# Patient Record
Sex: Male | Born: 1960 | Race: Black or African American | Hispanic: No | Marital: Single | Smoking: Never smoker
Health system: Southern US, Community
[De-identification: ages and names within clinical notes are randomized; demographics above are authoritative.]

## PROBLEM LIST (undated history)

## (undated) DIAGNOSIS — I10 Essential (primary) hypertension: Secondary | ICD-10-CM

---

## 1998-03-24 ENCOUNTER — Encounter: Admission: RE | Admit: 1998-03-24 | Discharge: 1998-03-24 | Payer: Self-pay | Admitting: *Deleted

## 2018-10-10 ENCOUNTER — Encounter (HOSPITAL_COMMUNITY): Payer: Self-pay

## 2018-10-10 ENCOUNTER — Other Ambulatory Visit: Payer: Self-pay

## 2018-10-10 ENCOUNTER — Emergency Department (HOSPITAL_COMMUNITY): Payer: BLUE CROSS/BLUE SHIELD

## 2018-10-10 ENCOUNTER — Emergency Department (HOSPITAL_COMMUNITY)
Admission: EM | Admit: 2018-10-10 | Discharge: 2018-10-10 | Disposition: A | Discharge status: Home / Self Care | Payer: BLUE CROSS/BLUE SHIELD | Attending: Emergency Medicine | Admitting: Emergency Medicine

## 2018-10-10 DIAGNOSIS — N451 Epididymitis: Secondary | ICD-10-CM

## 2018-10-10 DIAGNOSIS — N50811 Right testicular pain: Secondary | ICD-10-CM | POA: Diagnosis present

## 2018-10-10 MED ORDER — LEVOFLOXACIN 500 MG PO TABS: 500.0000 mg | ORAL_TABLET | Freq: Every day | ORAL | 0 refills | Status: DC

## 2018-10-10 MED ORDER — CEFTRIAXONE SODIUM 250 MG IJ SOLR
250.0000 mg | Freq: Once | INTRAMUSCULAR | Status: AC
  Administered 2018-10-10: 250 mg via INTRAMUSCULAR
  Filled 2018-10-10: qty 250

## 2018-10-10 MED ORDER — STERILE WATER FOR INJECTION IJ SOLN
INTRAMUSCULAR | Status: AC
  Administered 2018-10-10: 1 mL
  Filled 2018-10-10: qty 10

## 2018-10-10 NOTE — ED Triage Notes (Signed)
Patient c/o right testicular swelling and pain since yesterday.

## 2018-10-10 NOTE — ED Provider Notes (Signed)
[facility]-EMERGENCY DEPT Provider Note   CSN: [id number] Arrival date & time: 10/10/18  1607    History   Chief Complaint Chief Complaint  Patient presents with  . Groin Swelling    HPI Connor Matthews is a 58 y.o. male who presents with testicular pain and swelling. No significant PMH. He states that for the past 2 days he has had swelling and pain of his right testicle. Nothing makes it better or worse. He has not had any difficult urinating. He has never had this before. He is sexually active with a new partner and does not use condoms. No fever, vomiting, abdominal pain, penile discharge.   HPI  History reviewed. No pertinent past medical history.  There are no active problems to display for this patient.   History reviewed. No pertinent surgical history.      Home Medications    Prior to Admission medications   Not on File    Family History Family History  Problem Relation Age of Onset  . Hypertension Mother     Social History Social History   Tobacco Use  . Smoking status: Never Smoker  . Smokeless tobacco: Never Used  Substance Use Topics  . Alcohol use: Yes    Comment: occasionally  . Drug use: Never     Allergies   Patient has no known allergies.   Review of Systems Review of Systems  Constitutional: Negative for fever.  Gastrointestinal: Negative for abdominal pain and vomiting.  Genitourinary: Positive for scrotal swelling and testicular pain. Negative for difficulty urinating, discharge, dysuria, flank pain and penile pain.  All other systems reviewed and are negative.    Physical Exam Updated Vital Signs BP (!) 177/89 (BP Location: Right Arm)   Pulse 86   Temp 98.6 F (37 C) (Oral)   Resp 18   Ht 5\' 11"  (1.803 m)   Wt (!) 138.3 kg   SpO2 100%   BMI 42.54 kg/m   Physical Exam Vitals signs and nursing note reviewed.  Constitutional:      General: He is not in acute distress.    Appearance: He is  well-developed. He is obese. He is not ill-appearing.     Comments: Calm and cooperative  HENT:     Head: Normocephalic and atraumatic.  Eyes:     General: No scleral icterus.       Right eye: No discharge.        Left eye: No discharge.     Conjunctiva/sclera: Conjunctivae normal.     Pupils: Pupils are equal, round, and reactive to light.  Neck:     Musculoskeletal: Normal range of motion.  Cardiovascular:     Rate and Rhythm: Normal rate and regular rhythm.  Pulmonary:     Effort: Pulmonary effort is normal. No respiratory distress.     Breath sounds: Normal breath sounds.  Abdominal:     General: There is no distension.     Palpations: Abdomen is soft.     Tenderness: There is no abdominal tenderness.  Genitourinary:    Comments: No inguinal lymphadenopathy or inguinal hernia noted. Normal uncircumcised penis free of lesions or rash. Right testicle is red and swollen. There is whitish-yellow discharge from the penis. Chaperone present during exam.   Skin:    General: Skin is warm and dry.  Neurological:     Mental Status: He is alert and oriented to person, place, and time.  Psychiatric:  Behavior: Behavior normal.      ED Treatments / Results  Labs (all labs ordered are listed, but only abnormal results are displayed) Labs Reviewed  RPR  HIV ANTIBODY (ROUTINE TESTING W REFLEX)  URINALYSIS, ROUTINE W REFLEX MICROSCOPIC  GC/CHLAMYDIA PROBE AMP () NOT AT [facility]    EKG None  Radiology US Scrotum W/doppler  Result Date: 10/10/2018 CLINICAL DATA:  Right scrotal pain and swelling for 1 day EXAM: SCROTAL ULTRASOUND DOPPLER ULTRASOUND OF THE TESTICLES TECHNIQUE: Complete ultrasound examination of the testicles, epididymis, and other scrotal structures was performed. Color and spectral Doppler ultrasound were also utilized to evaluate blood flow to the testicles. COMPARISON:  None. FINDINGS: Right testicle Measurements: 3.2 x 2.6 x 2.1 cm. No mass or  microlithiasis visualized. Left testicle Measurements: 3.3 x 1.9 x 2.2 cm. No mass or microlithiasis visualized. Right epididymis: Right epididymis appears diffusely thickened and hypervascular, suggesting acute epididymitis. There is prominent thickening of the right superior scrotal soft tissues with associated mixed echogenicity. Left epididymis:  Within normal limits. Hydrocele:  Small right hydrocele.  No left hydrocele. Varicocele: No right varicocele. Mild left varicocele with echogenic lumen probably due to slow flow. Pulsed Doppler interrogation of both testes demonstrates normal low resistance arterial and venous waveforms bilaterally. IMPRESSION: 1. Normal testes.  No evidence of testicular torsion. 2. Diffusely thickened and hypervascular right epididymis, suggesting acute epididymitis. 3. Prominent thickening of the right superior scrotal soft tissues with associated mixed echogenicity, of uncertain etiology. While potentially representing reactive edema, a right inguinal hernia can not be excluded based on these images. Suggest CT abdomen/pelvis with oral and IV contrast for further evaluation. 4. Small right hydrocele. 5. Mild left varicocele. Electronically Signed   By: Delbert Phenix M.D.   On: 10/10/2018 19:00    Procedures Procedures (including critical care time)  Medications Ordered in ED Medications  cefTRIAXone (ROCEPHIN) injection 250 mg (has no administration in time range)     Initial Impression / Assessment and Plan / ED Course  I have reviewed the triage vital signs and the nursing notes.  Pertinent labs & imaging results that were available during my care of the patient were reviewed by me and considered in my medical decision making (see chart for details).  58 year old male presents with R testicular pain and swelling. He is hypertensive but otherwise vitals are normal. On exam his right testicle is red and swollen and has discharge from the penis. Although he is older,  suspect he may have an STD. [place] confirms epididymitis. He was given IM Rocephin here and rx for Levaquin. Advised return if worsening.  Final Clinical Impressions(s) / ED Diagnoses   Final diagnoses:  Epididymitis    ED Discharge Orders    None       Bethel Born, PA-C 10/10/18 1946    Benjiman Core, MD 10/10/18 2355

## 2018-10-10 NOTE — Discharge Instructions (Signed)
Take Levaquin (antibiotic) for the next 10 days You can also ice the swollen area and take Ibuprofen for pain Return if worsening

## 2018-10-11 LAB — HIV ANTIBODY (ROUTINE TESTING W REFLEX): HIV Screen 4th Generation wRfx: NONREACTIVE

## 2018-10-11 LAB — RPR: RPR Ser Ql: NONREACTIVE

## 2018-10-13 LAB — GC/CHLAMYDIA PROBE AMP (~~LOC~~) NOT AT ARMC
Chlamydia: NEGATIVE
Neisseria Gonorrhea: NEGATIVE

## 2019-06-18 IMAGING — US ULTRASOUND SCROTUM DOPPLER COMPLETE
1 series · 13 of 25 positions shown · non-contrast
Comparison: None.

CLINICAL DATA: Right scrotal pain and swelling for 1 day

EXAM:
SCROTAL ULTRASOUND
DOPPLER ULTRASOUND OF THE TESTICLES
TECHNIQUE: Complete ultrasound examination of the testicles, epididymis, and
other scrotal structures was performed. Color and spectral Doppler
ultrasound were also utilized to evaluate blood flow to the
testicles.

[Series 1: ultrasound scrotum doppler complete · 73 acquisitions, 13 frames shown]
[im 1/73]
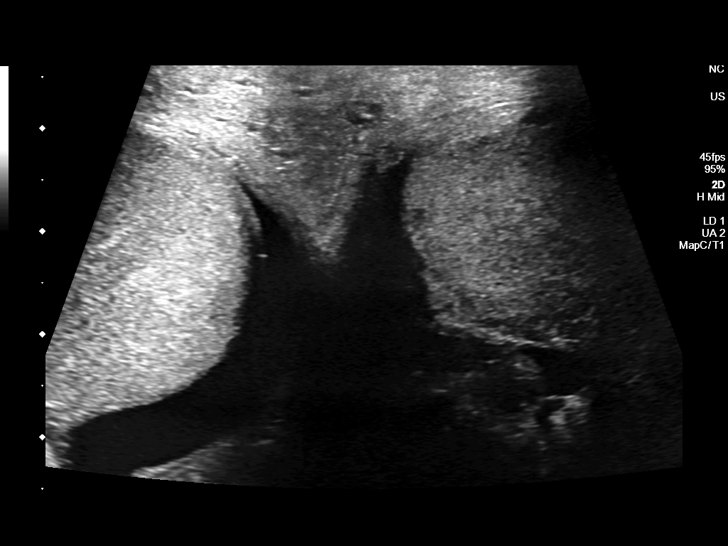
[im 7/73]
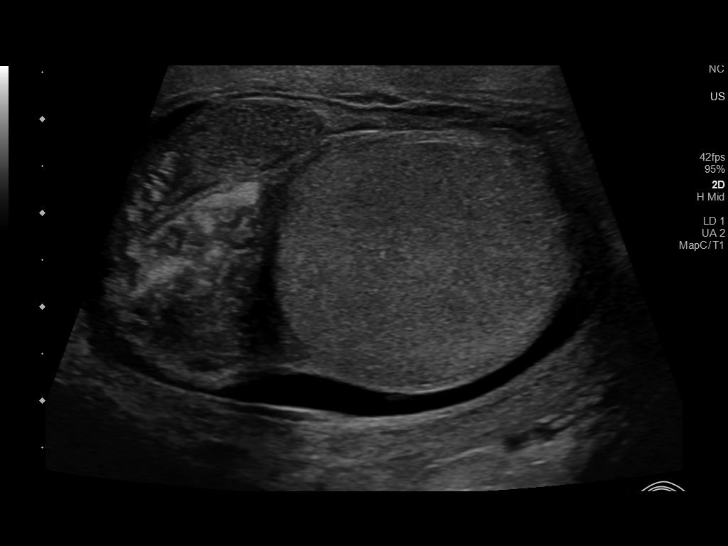
[im 13/73]
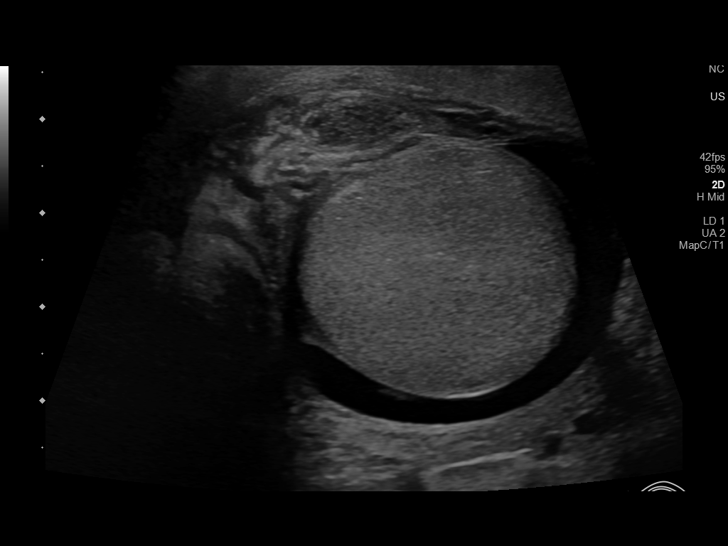
[im 19/73]
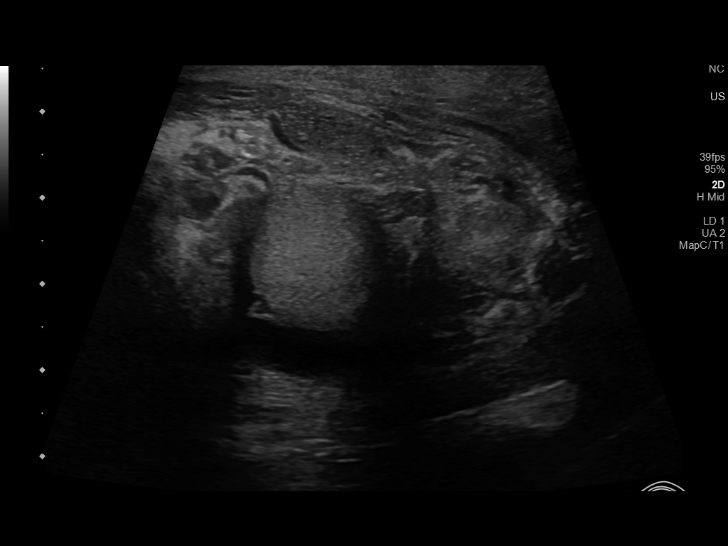
[im 25/73]
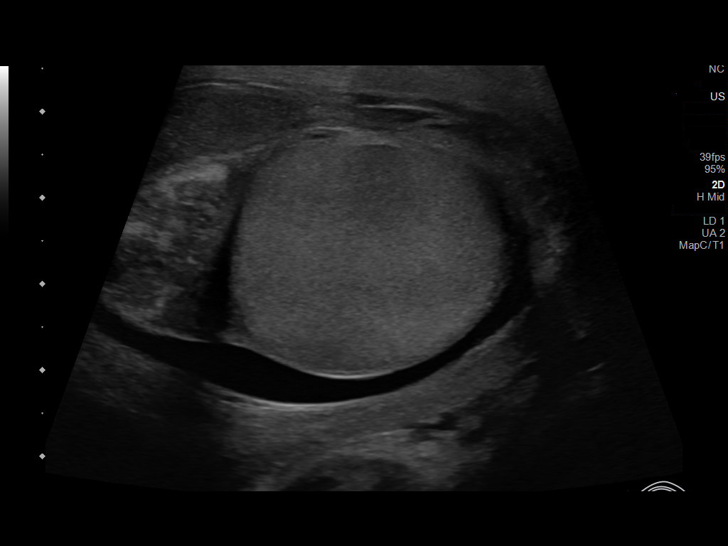
[im 31/73]
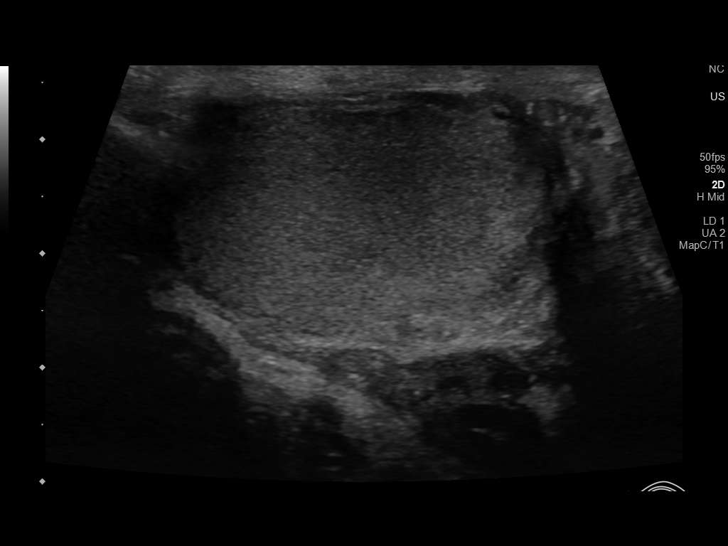
[im 37/73]
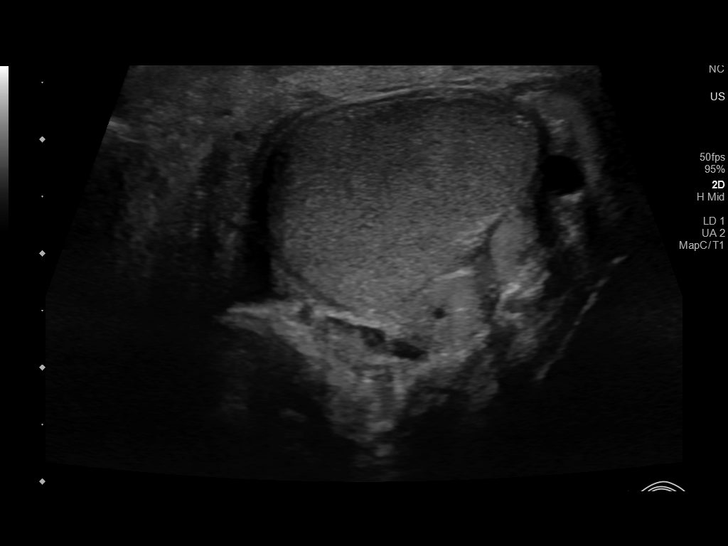
[im 43/73]
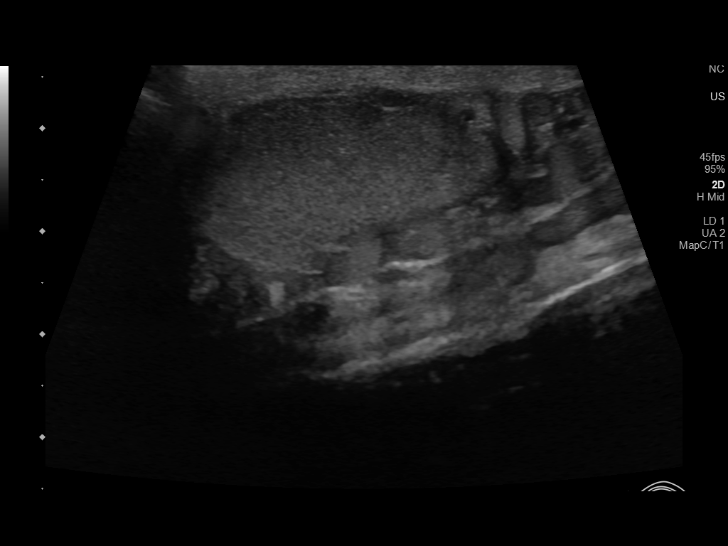
[im 49/73]
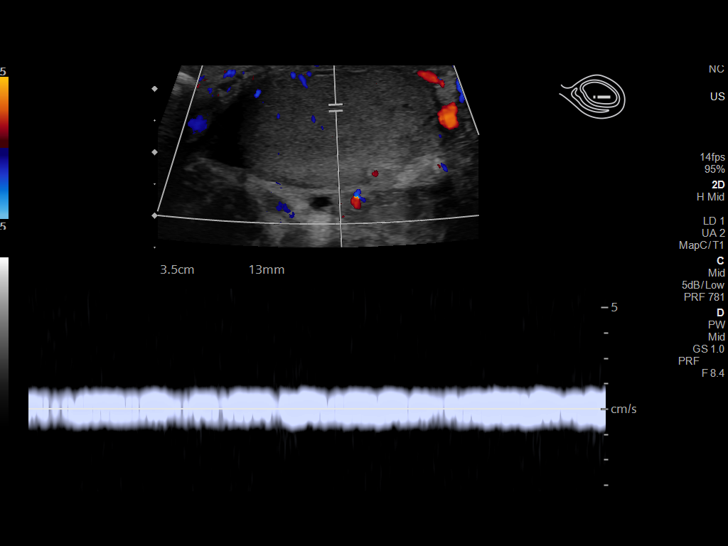
[im 55/73]
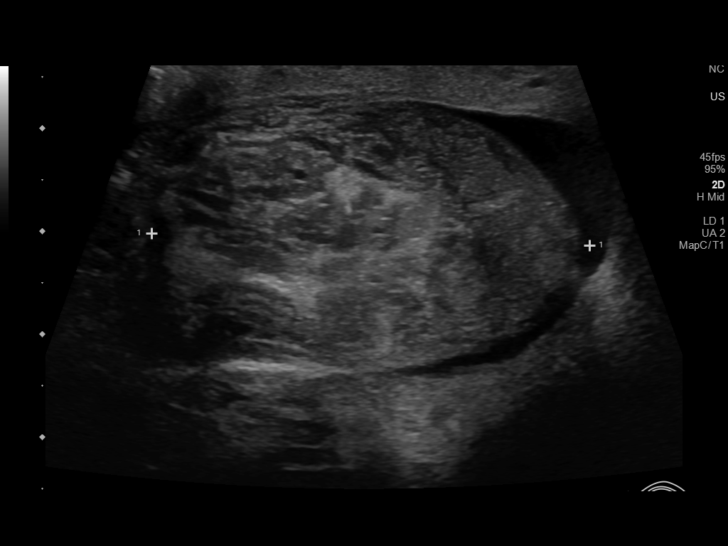
[im 61/73]
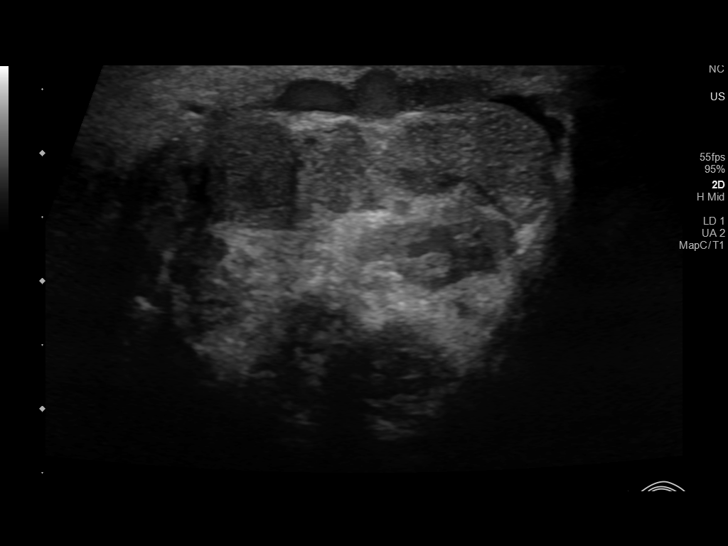
[im 67/73]
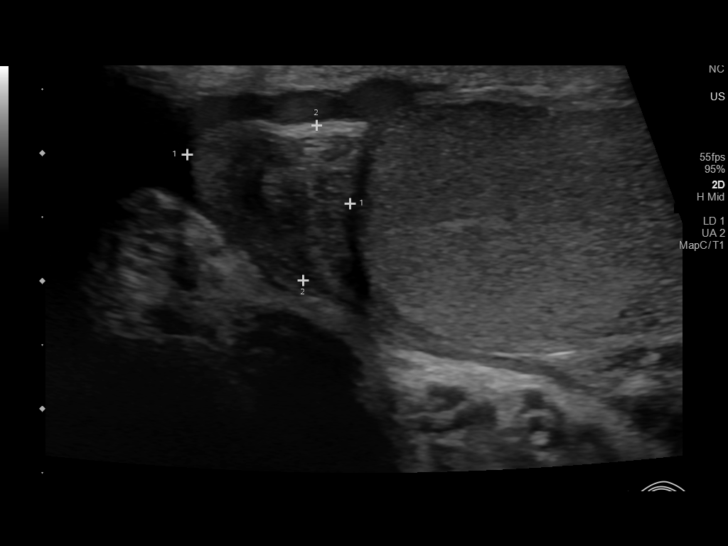
[im 73/73]
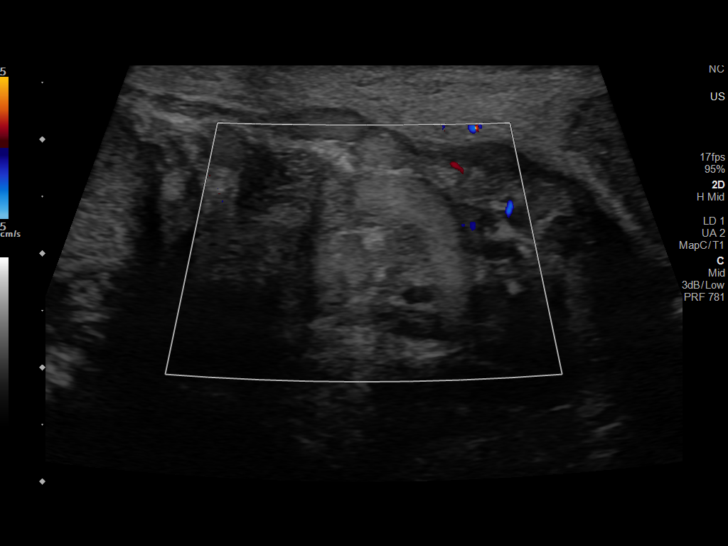

[13 of 25 positions shown; findings below may reference images not displayed]

FINDINGS: Right testicle

Measurements: 3.2 x 2.6 x 2.1 cm. No mass or microlithiasis
visualized.

Left testicle

Measurements: 3.3 x 1.9 x 2.2 cm. No mass or microlithiasis
visualized.

Right epididymis: Right epididymis appears diffusely thickened and
hypervascular, suggesting acute epididymitis. There is prominent
thickening of the right superior scrotal soft tissues with
associated mixed echogenicity.

Left epididymis:  Within normal limits.

Hydrocele:  Small right hydrocele.  No left hydrocele.

Varicocele: No right varicocele. Mild left varicocele with echogenic
lumen probably due to slow flow.

Pulsed Doppler interrogation of both testes demonstrates normal low
resistance arterial and venous waveforms bilaterally.
IMPRESSION: 1. Normal testes.  No evidence of testicular torsion.
2. Diffusely thickened and hypervascular right epididymis,
suggesting acute epididymitis.
3. Prominent thickening of the right superior scrotal soft tissues
with associated mixed echogenicity, of uncertain etiology. While
potentially representing reactive edema, a right inguinal hernia can
not be excluded based on these images. Suggest CT abdomen/pelvis
with oral and IV contrast for further evaluation.
4. Small right hydrocele.
5. Mild left varicocele.

## 2021-05-22 ENCOUNTER — Emergency Department (HOSPITAL_BASED_OUTPATIENT_CLINIC_OR_DEPARTMENT_OTHER)
Admission: EM | Admit: 2021-05-22 | Discharge: 2021-05-22 | Disposition: A | Discharge status: Home / Self Care | Payer: BLUE CROSS/BLUE SHIELD | Attending: Student | Admitting: Student

## 2021-05-22 ENCOUNTER — Other Ambulatory Visit: Payer: Self-pay

## 2021-05-22 ENCOUNTER — Emergency Department (HOSPITAL_BASED_OUTPATIENT_CLINIC_OR_DEPARTMENT_OTHER): Payer: BLUE CROSS/BLUE SHIELD | Admitting: Radiology

## 2021-05-22 ENCOUNTER — Encounter (HOSPITAL_BASED_OUTPATIENT_CLINIC_OR_DEPARTMENT_OTHER): Payer: Self-pay | Admitting: *Deleted

## 2021-05-22 DIAGNOSIS — I10 Essential (primary) hypertension: Secondary | ICD-10-CM | POA: Insufficient documentation

## 2021-05-22 DIAGNOSIS — M545 Low back pain, unspecified: Secondary | ICD-10-CM | POA: Insufficient documentation

## 2021-05-22 DIAGNOSIS — Y92410 Unspecified street and highway as the place of occurrence of the external cause: Secondary | ICD-10-CM | POA: Diagnosis not present

## 2021-05-22 HISTORY — DX: Essential (primary) hypertension: I10

## 2021-05-22 MED ORDER — METHOCARBAMOL 500 MG PO TABS: 500.0000 mg | ORAL_TABLET | Freq: Two times a day (BID) | ORAL | 0 refills | Status: DC
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
## 2022-01-17 ENCOUNTER — Encounter (HOSPITAL_COMMUNITY): Payer: Self-pay

## 2022-01-17 ENCOUNTER — Observation Stay (HOSPITAL_COMMUNITY)
Admission: EM | Admit: 2022-01-17 | Discharge: 2022-01-18 | Disposition: A | Discharge status: Home / Self Care | Payer: BC Managed Care – PPO | Attending: Internal Medicine | Admitting: Internal Medicine

## 2022-01-17 ENCOUNTER — Other Ambulatory Visit: Payer: Self-pay

## 2022-01-17 ENCOUNTER — Emergency Department (HOSPITAL_COMMUNITY): Payer: BC Managed Care – PPO

## 2022-01-17 DIAGNOSIS — M609 Myositis, unspecified: Secondary | ICD-10-CM

## 2022-01-17 DIAGNOSIS — I10 Essential (primary) hypertension: Secondary | ICD-10-CM

## 2022-01-17 DIAGNOSIS — M7989 Other specified soft tissue disorders: Secondary | ICD-10-CM | POA: Diagnosis present

## 2022-01-17 DIAGNOSIS — L03116 Cellulitis of left lower limb: Secondary | ICD-10-CM | POA: Diagnosis not present

## 2022-01-17 DIAGNOSIS — M60862 Other myositis, left lower leg: Secondary | ICD-10-CM | POA: Diagnosis not present

## 2022-01-17 DIAGNOSIS — L0390 Cellulitis, unspecified: Secondary | ICD-10-CM | POA: Diagnosis present

## 2022-01-17 LAB — CBC WITH DIFFERENTIAL/PLATELET
Abs Immature Granulocytes: 0.09 10*3/uL — ABNORMAL HIGH (ref 0.00–0.07)
Basophils Absolute: 0 10*3/uL (ref 0.0–0.1)
Basophils Relative: 0 %
Eosinophils Absolute: 0.2 10*3/uL (ref 0.0–0.5)
Eosinophils Relative: 2 %
HCT: 36.8 % — ABNORMAL LOW (ref 39.0–52.0)
Hemoglobin: 12 g/dL — ABNORMAL LOW (ref 13.0–17.0)
Immature Granulocytes: 1 %
Lymphocytes Relative: 23 %
Lymphs Abs: 2.7 10*3/uL (ref 0.7–4.0)
MCH: 31.8 pg (ref 26.0–34.0)
MCHC: 32.6 g/dL (ref 30.0–36.0)
MCV: 97.6 fL (ref 80.0–100.0)
Monocytes Absolute: 0.7 10*3/uL (ref 0.1–1.0)
Monocytes Relative: 6 %
Neutro Abs: 7.8 10*3/uL — ABNORMAL HIGH (ref 1.7–7.7)
Neutrophils Relative %: 68 %
Platelets: 372 10*3/uL (ref 150–400)
RBC: 3.77 MIL/uL — ABNORMAL LOW (ref 4.22–5.81)
RDW: 11.9 % (ref 11.5–15.5)
WBC: 11.5 10*3/uL — ABNORMAL HIGH (ref 4.0–10.5)
nRBC: 0 % (ref 0.0–0.2)

## 2022-01-17 LAB — BASIC METABOLIC PANEL
Anion gap: 7 (ref 5–15)
BUN: 13 mg/dL (ref 6–20)
CO2: 24 mmol/L (ref 22–32)
Calcium: 8.5 mg/dL — ABNORMAL LOW (ref 8.9–10.3)
Chloride: 110 mmol/L (ref 98–111)
Creatinine, Ser: 0.87 mg/dL (ref 0.61–1.24)
GFR, Estimated: >60 mL/min (ref >60)
Glucose, Bld: 94 mg/dL (ref 70–99)
Potassium: 4 mmol/L (ref 3.5–5.1)
Sodium: 141 mmol/L (ref 135–145)

## 2022-01-17 LAB — CK: Total CK: 532 U/L — ABNORMAL HIGH (ref 49–397)

## 2022-01-17 MED ORDER — HYDRALAZINE HCL 20 MG/ML IJ SOLN: 10.0000 mg | Freq: Four times a day (QID) | INTRAMUSCULAR | Status: DC | PRN

## 2022-01-17 MED ORDER — CEFAZOLIN SODIUM-DEXTROSE 2-4 GM/100ML-% IV SOLN
2.0000 g | Freq: Three times a day (TID) | INTRAVENOUS | Status: DC
  Administered 2022-01-17 – 2022-01-18 (×2): 2 g via INTRAVENOUS
  Filled 2022-01-17 (×3): qty 100

## 2022-01-17 MED ORDER — ACETAMINOPHEN 325 MG PO TABS: 650.0000 mg | ORAL_TABLET | Freq: Four times a day (QID) | ORAL | Status: DC | PRN

## 2022-01-17 MED ORDER — ACETAMINOPHEN 650 MG RE SUPP: 650.0000 mg | Freq: Four times a day (QID) | RECTAL | Status: DC | PRN

## 2022-01-17 MED ORDER — LOSARTAN POTASSIUM 25 MG PO TABS
25.0000 mg | ORAL_TABLET | Freq: Every day | ORAL | Status: DC
  Administered 2022-01-17 – 2022-01-18 (×2): 25 mg via ORAL
  Filled 2022-01-17 (×2): qty 1

## 2022-01-17 MED ORDER — KETOROLAC TROMETHAMINE 30 MG/ML IJ SOLN: 30.0000 mg | Freq: Four times a day (QID) | INTRAMUSCULAR | Status: DC | PRN

## 2022-01-17 MED ORDER — CEFAZOLIN SODIUM-DEXTROSE 2-4 GM/100ML-% IV SOLN
2.0000 g | Freq: Once | INTRAVENOUS | Status: AC
  Administered 2022-01-17: 2 g via INTRAVENOUS
  Filled 2022-01-17: qty 100

## 2022-01-17 MED ORDER — LACTATED RINGERS IV SOLN: INTRAVENOUS | Status: DC

## 2022-01-17 MED ORDER — ONDANSETRON HCL 4 MG PO TABS: 4.0000 mg | ORAL_TABLET | Freq: Four times a day (QID) | ORAL | Status: DC | PRN

## 2022-01-17 MED ORDER — ENOXAPARIN SODIUM 40 MG/0.4ML IJ SOSY
40.0000 mg | PREFILLED_SYRINGE | INTRAMUSCULAR | Status: DC
  Administered 2022-01-17: 40 mg via SUBCUTANEOUS
  Filled 2022-01-17: qty 0.4

## 2022-01-17 MED ORDER — ONDANSETRON HCL 4 MG/2ML IJ SOLN: 4.0000 mg | Freq: Four times a day (QID) | INTRAMUSCULAR | Status: DC | PRN

## 2022-01-17 NOTE — Plan of Care (Signed)
Problem: Education: Goal: Knowledge of General Education information will improve Description: Including pain rating scale, medication(s)/side effects and non-pharmacologic comfort measures Outcome: Progressing   Problem: Clinical Measurements: Goal: Respiratory complications will improve Outcome: Progressing  Goal: Cardiovascular complication will be avoided Outcome: Progressing   Problem: Pain Managment: Goal: General experience of comfort will improve Outcome: Progressing   Problem: Safety: Goal: Ability to remain free from injury will improve Outcome: Progressing   Problem: Skin Integrity: Goal: Risk for impaired skin integrity will decrease Outcome: Progressing   

## 2022-01-17 NOTE — H&P (Signed)
History and Physical    Connor Matthews SHF:[id number] DOB: November 21, 1960 DOA: 01/17/2022  PCP: Pcp, No  Patient coming from: home  I have personally briefly reviewed patient's old medical records in [facility]  Chief Complaint: Pain and swelling of his left leg  HPI: Connor Matthews is a 61 y.o. male with medical history significant of hypertension who was previously on medication, but has not taken any in quite some time, noted worsening swelling and pain in his left lower extremity.  Patient reports that he works unloading trucks with a forklift.  He had noted that as he was moving around at work he was having pain/swelling in his left leg.  He went to urgent care where he reportedly was started on doxycycline.  He feels as though he may have had an ultrasound to rule out DVT although I do not have any records of this.  He has not had any fever.  He has not noticed any wound or discharge from his skin.  He has not had any vomiting, diarrhea, shortness of breath or chest pain.  Since his symptoms have persisted over the past 6 days despite taking doxycycline, he was sent to the ER for evaluation.  Review of Systems: As per HPI otherwise 10 point review of systems negative.    Past Medical History:  Diagnosis Date   Hypertension     History reviewed. No pertinent surgical history.  Social History:  reports that he has never smoked. He has never used smokeless tobacco. He reports current alcohol use. He reports that he does not use drugs.  No Known Allergies  Family History  Problem Relation Age of Onset   Hypertension Mother      Prior to Admission medications   Medication Sig Start Date End Date Taking? Authorizing Provider  doxycycline (MONODOX) 100 MG capsule Take 100 mg by mouth in the morning and at bedtime. 10 day course. Pt is on day 6. 01/11/22  Yes [provider]  Multiple Vitamin (MULTIVITAMIN) tablet Take 1 tablet by mouth daily.   Yes [provider]  Omega-3 Fatty Acids (FISH OIL PO) Take 1 tablet by mouth daily.   Yes [provider]    Physical Exam: Vitals:   01/17/22 1415 01/17/22 1607 01/17/22 1714 01/17/22 1812  BP: (!) 160/93 (!) 181/71 (!) 188/79   Pulse: 65 63 65   Resp: 16 16 16    Temp:   98 F (36.7 C)   TempSrc:   Oral   SpO2: 99% 99% 100%   Weight:    95.7 kg  Height:    5\' 11"  (1.803 m)    Constitutional: NAD, calm, comfortable Eyes: PERRL, lids and conjunctivae normal ENMT: Mucous membranes are moist. Posterior pharynx clear of any exudate or lesions.Normal dentition.  Neck: normal, supple, no masses, no thyromegaly Respiratory: clear to auscultation bilaterally, no wheezing, no crackles. Normal respiratory effort. No accessory muscle use.  Cardiovascular: Regular rate and rhythm, no murmurs / rubs / gallops. No extremity edema. 2+ pedal pulses. No carotid bruits.  Abdomen: no tenderness, no masses palpated. No hepatosplenomegaly. Bowel sounds positive.  Musculoskeletal: no clubbing / cyanosis. No joint deformity upper and lower extremities. Good ROM, no contractures. Normal muscle tone.  Skin: Darkening of skin over left lower leg.  Left leg is more edematous.  Warm to touch. Neurologic: CN 2-12 grossly intact. Sensation intact, DTR normal. Strength 5/5 in all 4.  Psychiatric: Normal judgment and insight. Alert and oriented  x 3. Normal mood.    Labs on Admission: I have personally reviewed following labs and imaging studies  CBC: Recent Labs  Lab 01/17/22 1223  WBC 11.5*  NEUTROABS 7.8*  HGB 12.0*  HCT 36.8*  MCV 97.6  PLT 372   Basic Metabolic Panel: Recent Labs  Lab 01/17/22 1300  NA 141  K 4.0  CL 110  CO2 24  GLUCOSE 94  BUN 13  CREATININE 0.87  CALCIUM 8.5*   GFR: Estimated Creatinine Clearance: 106.6 mL/min (by C-G formula based on SCr of 0.87 mg/dL). Liver Function Tests: No results for input(s): "AST", "ALT", "ALKPHOS", "BILITOT", "PROT", "ALBUMIN" in the  last 168 hours. No results for input(s): "LIPASE", "AMYLASE" in the last 168 hours. No results for input(s): "AMMONIA" in the last 168 hours. Coagulation Profile: No results for input(s): "INR", "PROTIME" in the last 168 hours. Cardiac Enzymes: Recent Labs  Lab 01/17/22 1300  CKTOTAL 532*   BNP (last 3 results) No results for input(s): "PROBNP" in the last 8760 hours. HbA1C: No results for input(s): "HGBA1C" in the last 72 hours. CBG: No results for input(s): "GLUCAP" in the last 168 hours. Lipid Profile: No results for input(s): "CHOL", "HDL", "LDLCALC", "TRIG", "CHOLHDL", "LDLDIRECT" in the last 72 hours. Thyroid Function Tests: No results for input(s): "TSH", "T4TOTAL", "FREET4", "T3FREE", "THYROIDAB" in the last 72 hours. Anemia Panel: No results for input(s): "VITAMINB12", "FOLATE", "FERRITIN", "TIBC", "IRON", "RETICCTPCT" in the last 72 hours. Urine analysis: No results found for: "COLORURINE", "APPEARANCEUR", "LABSPEC", "PHURINE", "GLUCOSEU", "HGBUR", "BILIRUBINUR", "KETONESUR", "PROTEINUR", "UROBILINOGEN", "NITRITE", "LEUKOCYTESUR"  Radiological Exams on Admission: DG Foot Complete Left  Result Date: 01/17/2022 CLINICAL DATA:  Diffuse foot pain and swelling for 5 days. Evaluate for gas. EXAM: LEFT FOOT - COMPLETE 3+ VIEW COMPARISON:  None Available. FINDINGS: Mild great toe metatarsophalangeal joint space narrowing and peripheral osteophytosis. Mild dorsal talonavicular, navicular-cuneiform, and tarsometatarsal degenerative osteophytosis on lateral view. Moderate degenerative spurring at the distal aspect of the medial malleolus with an adjacent well corticated ossicle. Similar degenerative spurring versus ossicle distal to the fibula. There appears to be mild-to-moderate lateral greater than medial ankle soft tissue swelling. Mild chronic enthesopathic change at the Achilles insertion on the calcaneus. No definite acute fracture is seen.  No dislocation. No subcutaneous gas.  IMPRESSION: 1. Mild midfoot osteoarthritis. Chronic appearing degenerative ossicles distal to the medial and lateral malleoli, not well evaluated on these foot radiographs. 2. No acute fracture is seen. 3. Apparent moderate lateral and mild medial malleolar soft tissue swelling. No subcutaneous gas is seen. Electronically Signed   By: Neita Garnet M.D.   On: 01/17/2022 14:06      Assessment/Plan Principal Problem:   Cellulitis Active Problems:   Essential hypertension     Left lower extremity cellulitis -We will check venous ultrasounds to evaluate for any developing DVT -He has been started on Ancef -Keep lower extremity elevated -Reevaluate in a.m. and if improvement, can transition to Keflex versus Augmentin  Hypertension -Start on losartan -He will need to establish care with a new primary care physician for further management.  DVT prophylaxis: Lovenox Code Status: Full code Family Communication: Discussed with patient Disposition Plan: Discharge home once cellulitis has improved Consults called:   Admission status: Observation, MedSurg  Erick Blinks MD [facility]   If 7PM-7AM, please contact night-coverage [email or web address]   01/17/2022, 6:13 PM

## 2022-01-17 NOTE — ED Triage Notes (Signed)
Pt reports increased discoloration and swelling in left lower extremity. Pt reports negative DVT study.

## 2022-01-17 NOTE — ED Notes (Addendum)
Patient was unaware of admission and why he was being admitted. Veneda Melter, MD notified to please come and speak to the patient on why he is being admitted and the plan of care.

## 2022-01-18 ENCOUNTER — Observation Stay (HOSPITAL_BASED_OUTPATIENT_CLINIC_OR_DEPARTMENT_OTHER): Payer: BC Managed Care – PPO

## 2022-01-18 DIAGNOSIS — I10 Essential (primary) hypertension: Secondary | ICD-10-CM | POA: Diagnosis not present

## 2022-01-18 DIAGNOSIS — M7989 Other specified soft tissue disorders: Secondary | ICD-10-CM | POA: Diagnosis not present

## 2022-01-18 DIAGNOSIS — L03116 Cellulitis of left lower limb: Secondary | ICD-10-CM | POA: Diagnosis not present

## 2022-01-18 DIAGNOSIS — M79605 Pain in left leg: Secondary | ICD-10-CM

## 2022-01-18 LAB — CBC
HCT: 39.9 % (ref 39.0–52.0)
Hemoglobin: 12.6 g/dL — ABNORMAL LOW (ref 13.0–17.0)
MCH: 31.7 pg (ref 26.0–34.0)
MCHC: 31.6 g/dL (ref 30.0–36.0)
MCV: 100.5 fL — ABNORMAL HIGH (ref 80.0–100.0)
Platelets: 281 10*3/uL (ref 150–400)
RBC: 3.97 MIL/uL — ABNORMAL LOW (ref 4.22–5.81)
RDW: 11.6 % (ref 11.5–15.5)
WBC: 10.3 10*3/uL (ref 4.0–10.5)
nRBC: 0 % (ref 0.0–0.2)

## 2022-01-18 LAB — BASIC METABOLIC PANEL
Anion gap: 8 (ref 5–15)
BUN: 9 mg/dL (ref 6–20)
CO2: 24 mmol/L (ref 22–32)
Calcium: 8.7 mg/dL — ABNORMAL LOW (ref 8.9–10.3)
Chloride: 106 mmol/L (ref 98–111)
Creatinine, Ser: 0.72 mg/dL (ref 0.61–1.24)
GFR, Estimated: >60 mL/min (ref >60)
Glucose, Bld: 107 mg/dL — ABNORMAL HIGH (ref 70–99)
Potassium: 4.3 mmol/L (ref 3.5–5.1)
Sodium: 138 mmol/L (ref 135–145)

## 2022-01-18 MED ORDER — CEPHALEXIN 500 MG PO CAPS: 500.0000 mg | ORAL_CAPSULE | Freq: Four times a day (QID) | ORAL | 0 refills | Status: AC

## 2022-01-18 MED ORDER — LOSARTAN POTASSIUM 50 MG PO TABS
50.0000 mg | ORAL_TABLET | Freq: Every day | ORAL | 1 refills | Status: AC
Start: 2022-01-19 — End: ?

## 2022-01-18 NOTE — Progress Notes (Signed)
Transition of Care [facility]) Screening Note  Patient Details  Name: Connor Matthews Date of Birth: 06/29/1961  Transition of Care [facility]) CM/SW Contact:    Ewing Schlein, LCSW Phone Number: 01/18/2022, 10:40 AM  Transition of Care Department [facility]) has reviewed patient and no TOC needs have been identified at this time. We will continue to monitor patient advancement through interdisciplinary progression rounds. If new patient transition needs arise, please place a TOC consult.

## 2022-01-18 NOTE — Progress Notes (Signed)
AVS given and explained to patient, letter to work from MD also given.

## 2022-01-18 NOTE — Plan of Care (Signed)
  Problem: Education: Goal: Knowledge of General Education information will improve Description: Including pain rating scale, medication(s)/side effects and non-pharmacologic comfort measures Outcome: Adequate for Discharge   Problem: Health Behavior/Discharge Planning: Goal: Ability to manage health-related needs will improve Outcome: Adequate for Discharge   Problem: Clinical Measurements: Goal: Ability to maintain clinical measurements within normal limits will improve Outcome: Adequate for Discharge Goal: Will remain free from infection Outcome: Adequate for Discharge Goal: Diagnostic test results will improve Outcome: Adequate for Discharge Goal: Respiratory complications will improve Outcome: Adequate for Discharge Goal: Cardiovascular complication will be avoided Outcome: Adequate for Discharge   Problem: Coping: Goal: Level of anxiety will decrease Outcome: Adequate for Discharge   Problem: Elimination: Goal: Will not experience complications related to urinary retention Outcome: Adequate for Discharge   Problem: Pain Managment: Goal: General experience of comfort will improve Outcome: Adequate for Discharge   Problem: Safety: Goal: Ability to remain free from injury will improve Outcome: Adequate for Discharge   Problem: Skin Integrity: Goal: Risk for impaired skin integrity will decrease Outcome: Adequate for Discharge   Problem: Education: Goal: Knowledge of General Education information will improve Description: Including pain rating scale, medication(s)/side effects and non-pharmacologic comfort measures Outcome: Adequate for Discharge   Problem: Health Behavior/Discharge Planning: Goal: Ability to manage health-related needs will improve Outcome: Adequate for Discharge   Problem: Clinical Measurements: Goal: Ability to maintain clinical measurements within normal limits will improve Outcome: Adequate for Discharge Goal: Will remain free from  infection Outcome: Adequate for Discharge Goal: Diagnostic test results will improve Outcome: Adequate for Discharge Goal: Respiratory complications will improve Outcome: Adequate for Discharge Goal: Cardiovascular complication will be avoided Outcome: Adequate for Discharge   Problem: Coping: Goal: Level of anxiety will decrease Outcome: Adequate for Discharge   Problem: Elimination: Goal: Will not experience complications related to bowel motility Outcome: Adequate for Discharge Goal: Will not experience complications related to urinary retention Outcome: Adequate for Discharge   Problem: Pain Managment: Goal: General experience of comfort will improve Outcome: Adequate for Discharge   Problem: Safety: Goal: Ability to remain free from injury will improve Outcome: Adequate for Discharge   Problem: Skin Integrity: Goal: Risk for impaired skin integrity will decrease Outcome: Adequate for Discharge   Problem: Clinical Measurements: Goal: Ability to avoid or minimize complications of infection will improve Outcome: Adequate for Discharge   Problem: Skin Integrity: Goal: Skin integrity will improve Outcome: Adequate for Discharge

## 2022-01-18 NOTE — Progress Notes (Signed)
LLE venous duplex has been completed.  Results can be found under chart review under CV PROC. 01/18/2022 10:39 AM Tarika Mckethan RVT, RDMS

## 2022-06-23 ENCOUNTER — Emergency Department (HOSPITAL_COMMUNITY)
Admission: EM | Admit: 2022-06-23 | Discharge: 2022-06-23 | Disposition: A | Discharge status: Home / Self Care | Payer: BC Managed Care – PPO | Attending: Emergency Medicine | Admitting: Emergency Medicine

## 2022-06-23 ENCOUNTER — Other Ambulatory Visit: Payer: Self-pay

## 2022-06-23 ENCOUNTER — Encounter (HOSPITAL_COMMUNITY): Payer: Self-pay

## 2022-06-23 DIAGNOSIS — L03116 Cellulitis of left lower limb: Secondary | ICD-10-CM

## 2022-06-23 DIAGNOSIS — D72829 Elevated white blood cell count, unspecified: Secondary | ICD-10-CM | POA: Insufficient documentation

## 2022-06-23 DIAGNOSIS — R2242 Localized swelling, mass and lump, left lower limb: Secondary | ICD-10-CM | POA: Diagnosis present

## 2022-06-23 LAB — CBC WITH DIFFERENTIAL/PLATELET
Abs Immature Granulocytes: 0.16 10*3/uL — ABNORMAL HIGH (ref 0.00–0.07)
Basophils Absolute: 0.1 10*3/uL (ref 0.0–0.1)
Basophils Relative: 1 %
Eosinophils Absolute: 0.4 10*3/uL (ref 0.0–0.5)
Eosinophils Relative: 3 %
HCT: 37.8 % — ABNORMAL LOW (ref 39.0–52.0)
Hemoglobin: 12 g/dL — ABNORMAL LOW (ref 13.0–17.0)
Immature Granulocytes: 1 %
Lymphocytes Relative: 25 %
Lymphs Abs: 3.1 10*3/uL (ref 0.7–4.0)
MCH: 30.7 pg (ref 26.0–34.0)
MCHC: 31.7 g/dL (ref 30.0–36.0)
MCV: 96.7 fL (ref 80.0–100.0)
Monocytes Absolute: 0.6 10*3/uL (ref 0.1–1.0)
Monocytes Relative: 5 %
Neutro Abs: 7.8 10*3/uL — ABNORMAL HIGH (ref 1.7–7.7)
Neutrophils Relative %: 65 %
Platelets: 361 10*3/uL (ref 150–400)
RBC: 3.91 MIL/uL — ABNORMAL LOW (ref 4.22–5.81)
RDW: 11.7 % (ref 11.5–15.5)
WBC: 12.2 10*3/uL — ABNORMAL HIGH (ref 4.0–10.5)
nRBC: 0 % (ref 0.0–0.2)

## 2022-06-23 LAB — COMPREHENSIVE METABOLIC PANEL
ALT: 26 U/L (ref 0–44)
AST: 26 U/L (ref 15–41)
Albumin: 3.5 g/dL (ref 3.5–5.0)
Alkaline Phosphatase: 60 U/L (ref 38–126)
Anion gap: 6 (ref 5–15)
BUN: 13 mg/dL (ref 6–20)
CO2: 26 mmol/L (ref 22–32)
Calcium: 8.7 mg/dL — ABNORMAL LOW (ref 8.9–10.3)
Chloride: 106 mmol/L (ref 98–111)
Creatinine, Ser: 0.99 mg/dL (ref 0.61–1.24)
GFR, Estimated: >60 mL/min (ref >60)
Glucose, Bld: 194 mg/dL — ABNORMAL HIGH (ref 70–99)
Potassium: 3.7 mmol/L (ref 3.5–5.1)
Sodium: 138 mmol/L (ref 135–145)
Total Bilirubin: 0.8 mg/dL (ref 0.3–1.2)
Total Protein: 7.7 g/dL (ref 6.5–8.1)

## 2022-06-23 MED ORDER — CEPHALEXIN 500 MG PO CAPS: ORAL_CAPSULE | ORAL | 0 refills | Status: AC

## 2022-06-23 MED ORDER — CEPHALEXIN 500 MG PO CAPS
1000.0000 mg | ORAL_CAPSULE | Freq: Once | ORAL | Status: AC
  Administered 2022-06-23: 1000 mg via ORAL
  Filled 2022-06-23: qty 2

## 2022-06-23 NOTE — ED Provider Notes (Signed)
[facility]   CSN: [id number] Arrival date & time: 06/23/22  0555     History  Chief Complaint  Patient presents with   Leg Swelling    Connor Matthews is a 61 y.o. male.  61 yo M with a chief complaints of left leg swelling.  Patient said he had something very similar about 6 months ago.  Was seen in the emergency department was diagnosed with cellulitis and was discharged home.  He feels like he needs a recurrent round of antibiotic therapy.  He denies any injury to the leg.  Denies history of DVT or PE.        Home Medications Prior to Admission medications   Medication Sig Start Date End Date Taking? Authorizing Provider  cephALEXin (KEFLEX) 500 MG capsule 2 caps po bid x 7 days 06/23/22  Yes Melene Plan, DO  losartan (COZAAR) 50 MG tablet Take 1 tablet (50 mg total) by mouth daily. 01/19/22   Erick Blinks, MD  Multiple Vitamin (MULTIVITAMIN) tablet Take 1 tablet by mouth daily.    [provider]  Omega-3 Fatty Acids (FISH OIL PO) Take 1 tablet by mouth daily.    [provider]      Allergies    Patient has no known allergies.    Review of Systems   Review of Systems  Physical Exam Updated Vital Signs BP (!) 151/65 (BP Location: Right Arm)   Pulse 75   Temp 98 F (36.7 C) (Oral)   Resp 18   Ht 5\' 11"  (1.803 m)   Wt 131.5 kg   SpO2 98%   BMI 40.45 kg/m  Physical Exam Vitals and nursing note reviewed.  Constitutional:      Appearance: He is well-developed.  HENT:     Head: Normocephalic and atraumatic.  Eyes:     Pupils: Pupils are equal, round, and reactive to light.  Neck:     Vascular: No JVD.  Cardiovascular:     Rate and Rhythm: Normal rate and regular rhythm.     Heart sounds: No murmur heard.    No friction rub. No gallop.  Pulmonary:     Effort: No respiratory distress.     Breath sounds: No wheezing.  Abdominal:     General: There is no distension.     Tenderness:  There is no abdominal tenderness. There is no guarding or rebound.  Musculoskeletal:        General: Normal range of motion.     Cervical back: Normal range of motion and neck supple.     Comments: Left lower extremity edema with some chronic overlying skin changes.  Mild erythema and warmth compared to the right.  No obvious break in the skin.  Skin:    Coloration: Skin is not pale.     Findings: No rash.  Neurological:     Mental Status: He is alert and oriented to person, place, and time.  Psychiatric:        Behavior: Behavior normal.     ED Results / Procedures / Treatments   Labs (all labs ordered are listed, but only abnormal results are displayed) Labs Reviewed  CBC WITH DIFFERENTIAL/PLATELET - Abnormal; Notable for the following components:      Result Value   WBC 12.2 (*)    RBC 3.91 (*)    Hemoglobin 12.0 (*)    HCT 37.8 (*)    Neutro Abs 7.8 (*)    Abs Immature  Granulocytes 0.16 (*)    All other components within normal limits  COMPREHENSIVE METABOLIC PANEL - Abnormal; Notable for the following components:   Glucose, Bld 194 (*)    Calcium 8.7 (*)    All other components within normal limits    EKG None  Radiology No results found.  Procedures Procedures    Medications Ordered in ED Medications  cephALEXin (KEFLEX) capsule 1,000 mg (has no administration in time range)    ED Course/ Medical Decision Making/ A&P                           Medical Decision Making Amount and/or Complexity of Data Reviewed Labs: ordered.  Risk Prescription drug management.   61 yo M with a chief complaints of left lower extremity edema.  He tells me it was very similar when he had cellulitis diagnosed previously.  Not obviously cellulitic on my exam.  More chronic appearing skin changes.  Encouraged him to elevate his legs.  Will start on a course of oral antibiotics.  Encouraged PCP follow-up.  He did have blood work performed that showed a leukocytosis with a  neutrophilic predominance.  No significant electrolyte abnormality.  Patient was hypertensive here.  Encouraged him to have this rechecked at his doctor's office.  8:20 AM:  I have discussed the diagnosis/risks/treatment options with the patient.  Evaluation and diagnostic testing in the emergency department does not suggest an emergent condition requiring admission or immediate intervention beyond what has been performed at this time.  They will follow up with PCP. We also discussed returning to the ED immediately if new or worsening sx occur. We discussed the sx which are most concerning (e.g., sudden worsening pain, fever, inability to tolerate by mouth) that necessitate immediate return. Medications administered to the patient during their visit and any new prescriptions provided to the patient are listed below.  Medications given during this visit Medications  cephALEXin (KEFLEX) capsule 1,000 mg (has no administration in time range)     The patient appears reasonably screen and/or stabilized for discharge and I doubt any other medical condition or other [facility] requiring further screening, evaluation, or treatment in the ED at this time prior to discharge.          Final Clinical Impression(s) / ED Diagnoses Final diagnoses:  Cellulitis of left lower extremity    Rx / DC Orders ED Discharge Orders          Ordered    cephALEXin (KEFLEX) 500 MG capsule        06/23/22 0809              [name], DO 06/23/22 0820

## 2022-06-23 NOTE — ED Triage Notes (Signed)
Left leg swelling x 2 days.   Says he has had problems with cellulitis in that leg. Has had some dependent LLE edema. Elevates legs at night but is not currently helping.

## 2022-06-23 NOTE — ED Notes (Signed)
Save blue tube in main lab °

## 2022-06-23 NOTE — Discharge Instructions (Addendum)
Return for rapid spreading or fever.  Follow up with a family doc in the office.

## 2022-09-25 IMAGING — DX DG FOOT COMPLETE 3+V*L*
3 series · 3 of 3 positions shown · non-contrast
Comparison: None Available.

CLINICAL DATA: Diffuse foot pain and swelling for 5 days. Evaluate
for gas.

EXAM:
LEFT FOOT - COMPLETE 3+ VIEW

[foot ap]
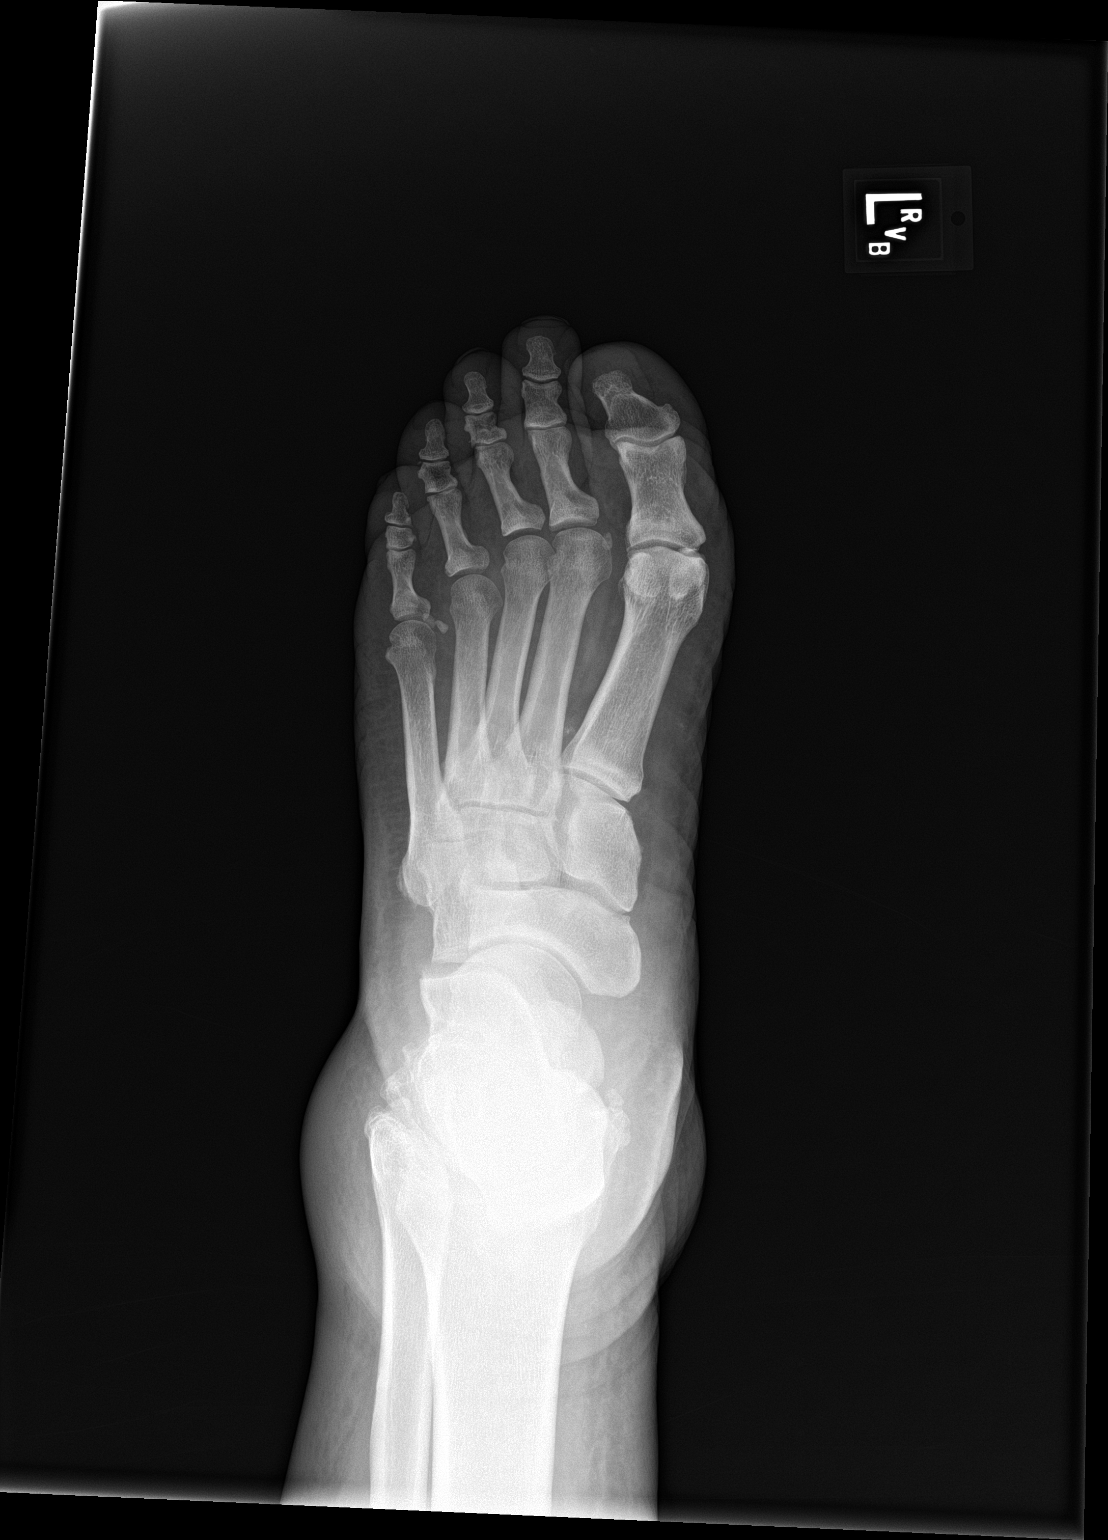

[foot obl]
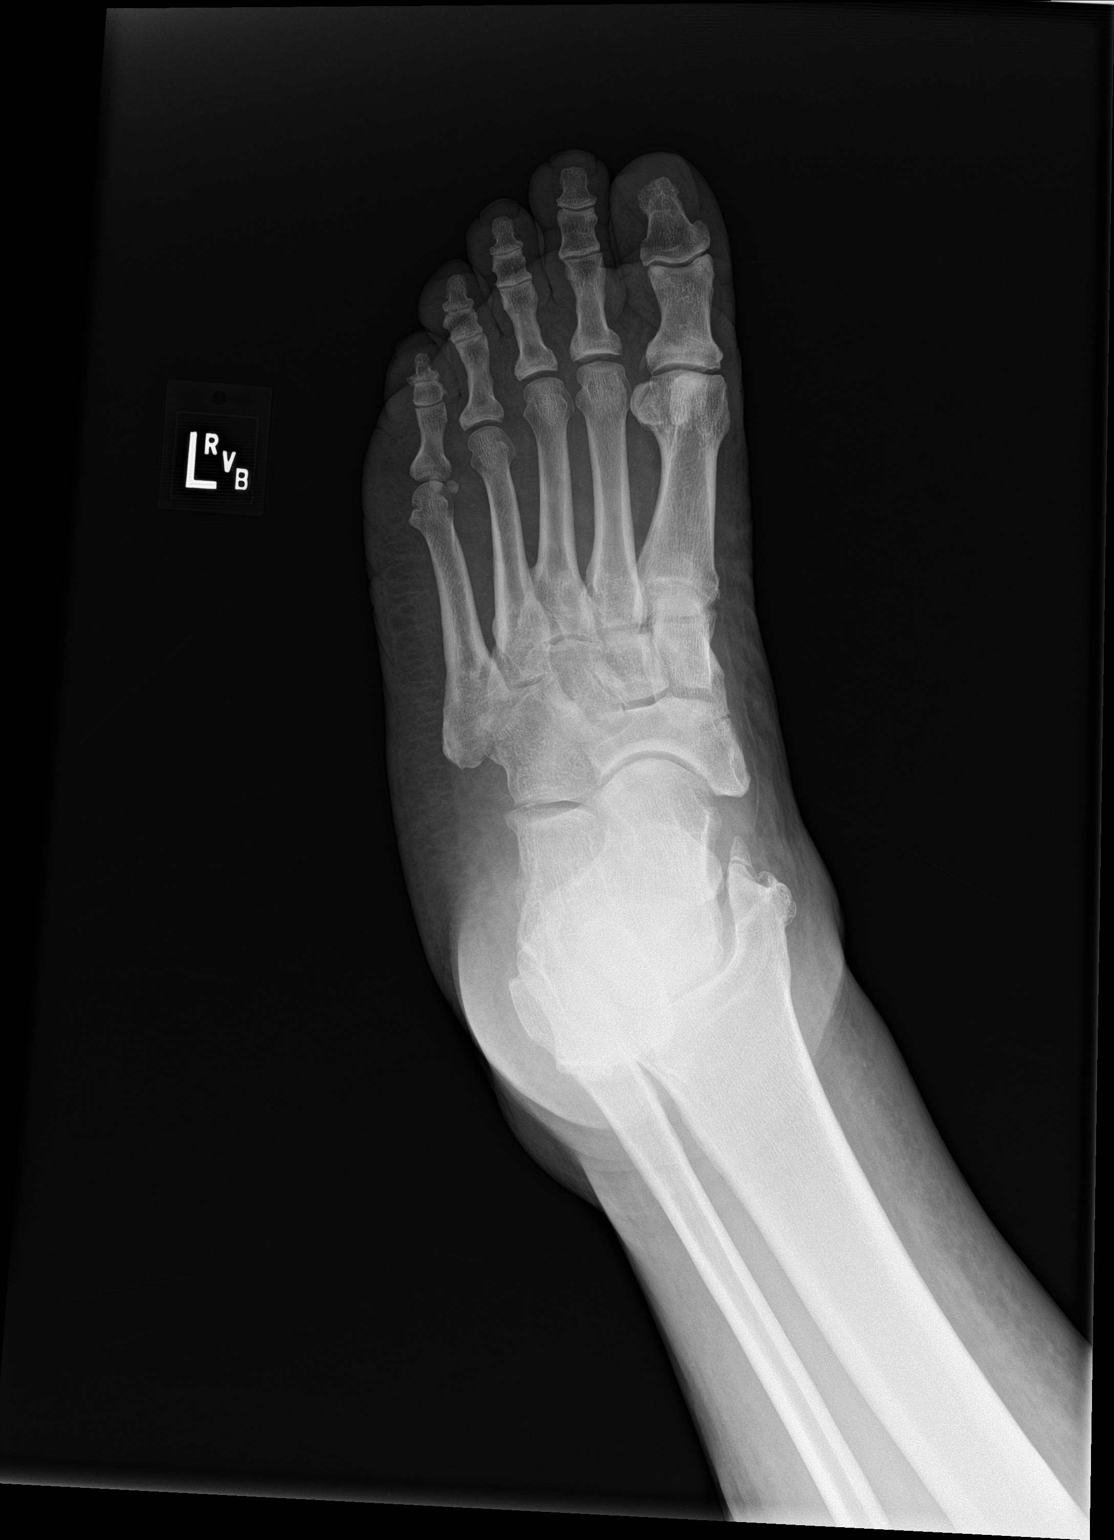

[foot lat]
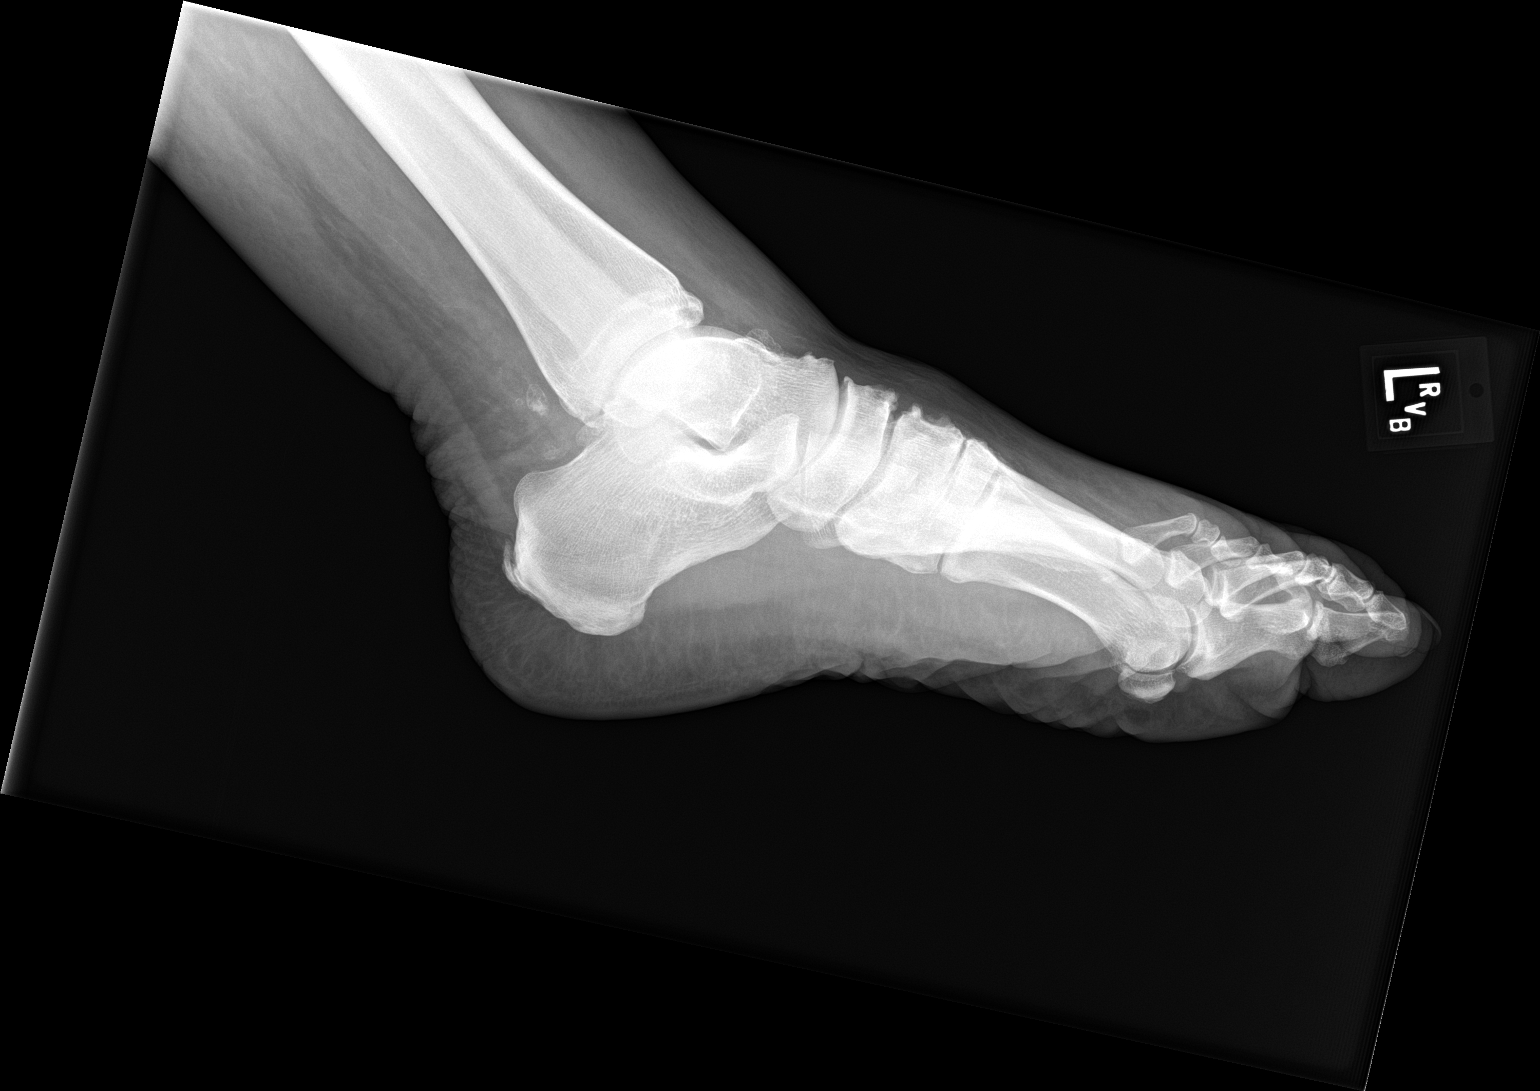

[3 of 3 positions shown; findings below may reference images not displayed]

FINDINGS: Mild great toe metatarsophalangeal joint space narrowing and
peripheral osteophytosis. Mild dorsal talonavicular,
navicular-cuneiform, and tarsometatarsal degenerative osteophytosis
on lateral view. Moderate degenerative spurring at the distal aspect
of the medial malleolus with an adjacent well corticated ossicle.
Similar degenerative spurring versus ossicle distal to the fibula.
There appears to be mild-to-moderate lateral greater than medial
ankle soft tissue swelling.

Mild chronic enthesopathic change at the Achilles insertion on the
calcaneus.

No definite acute fracture is seen.  No dislocation.

No subcutaneous gas.
IMPRESSION: 1. Mild midfoot osteoarthritis. Chronic appearing degenerative
ossicles distal to the medial and lateral malleoli, not well
evaluated on these foot radiographs.
2. No acute fracture is seen.
3. Apparent moderate lateral and mild medial malleolar soft tissue
swelling. No subcutaneous gas is seen.

## 2023-12-27 ENCOUNTER — Encounter: Payer: Self-pay | Admitting: Internal Medicine
